# Patient Record
Sex: Female | Born: 1980 | Race: White | Hispanic: No | Marital: Single
Health system: Southern US, Community
[De-identification: ages and names within clinical notes are randomized; demographics above are authoritative.]

---

## 2019-07-28 ENCOUNTER — Encounter: Payer: Self-pay | Admitting: Emergency Medicine

## 2019-07-28 ENCOUNTER — Other Ambulatory Visit: Payer: Self-pay

## 2019-07-28 ENCOUNTER — Emergency Department
Admission: EM | Admit: 2019-07-28 | Discharge: 2019-07-28 | Disposition: A | Discharge status: Home / Self Care | Payer: No Typology Code available for payment source | Attending: Student | Admitting: Student

## 2019-07-28 ENCOUNTER — Emergency Department: Payer: No Typology Code available for payment source

## 2019-07-28 DIAGNOSIS — G44309 Post-traumatic headache, unspecified, not intractable: Secondary | ICD-10-CM

## 2019-07-28 DIAGNOSIS — Y939 Activity, unspecified: Secondary | ICD-10-CM | POA: Insufficient documentation

## 2019-07-28 DIAGNOSIS — Y929 Unspecified place or not applicable: Secondary | ICD-10-CM | POA: Insufficient documentation

## 2019-07-28 DIAGNOSIS — S060X0A Concussion without loss of consciousness, initial encounter: Secondary | ICD-10-CM | POA: Insufficient documentation

## 2019-07-28 DIAGNOSIS — Y999 Unspecified external cause status: Secondary | ICD-10-CM | POA: Diagnosis not present

## 2019-07-28 DIAGNOSIS — S0990XA Unspecified injury of head, initial encounter: Secondary | ICD-10-CM | POA: Diagnosis present
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
## 2019-07-28 NOTE — Discharge Instructions (Signed)
Follow discharge care instruction.  Advised extra strength Tylenol for headache.  If headache persists may follow-up with neurologist.
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
